# Patient Record
Sex: Male | Born: 1995 | Race: White | Hispanic: No | Marital: Single | Smoking: Never smoker
Health system: Southern US, Community
[De-identification: ages and names within clinical notes are randomized; demographics above are authoritative.]

## PROBLEM LIST (undated history)

## (undated) HISTORY — PX: TONSILLECTOMY: SUR1361

---
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
## 2018-10-19 ENCOUNTER — Emergency Department
Admission: EM | Admit: 2018-10-19 | Discharge: 2018-10-19 | Disposition: A | Discharge status: Home / Self Care | Payer: BC Managed Care – PPO | Source: Home / Self Care

## 2018-10-19 ENCOUNTER — Encounter: Payer: Self-pay | Admitting: Emergency Medicine

## 2018-10-19 ENCOUNTER — Other Ambulatory Visit: Payer: Self-pay

## 2018-10-19 DIAGNOSIS — S30861A Insect bite (nonvenomous) of abdominal wall, initial encounter: Secondary | ICD-10-CM

## 2018-10-19 DIAGNOSIS — W57XXXA Bitten or stung by nonvenomous insect and other nonvenomous arthropods, initial encounter: Secondary | ICD-10-CM

## 2018-10-19 DIAGNOSIS — L089 Local infection of the skin and subcutaneous tissue, unspecified: Secondary | ICD-10-CM

## 2018-10-19 MED ORDER — DOXYCYCLINE HYCLATE 100 MG PO CAPS: 100.0000 mg | ORAL_CAPSULE | Freq: Two times a day (BID) | ORAL | 0 refills | Status: DC

## 2018-10-19 NOTE — ED Provider Notes (Signed)
[facility]    CSN: [id number] Arrival date & time: 10/19/18  1150     History   Chief Complaint Chief Complaint  Patient presents with  . Tick Removal    HPI Joseph Solis is a 23 y.o. male.   HPI Joseph Solis is a 23 y.o. male presenting to UC with c/o tick bite to his Right flank. He removed the tick 2 days ago, it was not engorged. Pt believes it was there for less than 24 hours but has noticed 4 red small itchy painful bumps in the same area as the tick bite. Pt denies fever, chills, n/v/d. His mother was concerned about the redness around the bumps and encouraged pt be evaluated.     No past medical history on file.  There are no active problems to display for this patient.   Past Surgical History:  Procedure Laterality Date  . TONSILLECTOMY         Home Medications    Prior to Admission medications   Medication Sig Start Date End Date Taking? Authorizing Provider  doxycycline (VIBRAMYCIN) 100 MG capsule Take 1 capsule (100 mg total) by mouth 2 (two) times daily. One po bid x 7 days 10/19/18   Lurene ShadowPhelps, Michaela Broski O, PA-C    Family History Family History  Problem Relation Age of Onset  . Healthy Mother   . Healthy Father     Social History Social History   Tobacco Use  . Smoking status: Never Smoker  . Smokeless tobacco: Never Used  Substance Use Topics  . Alcohol use: Yes  . Drug use: Not on file     Allergies   Patient has no known allergies.   Review of Systems Review of Systems  Constitutional: Negative for chills and fever.  Gastrointestinal: Negative for diarrhea, nausea and vomiting.  Musculoskeletal: Negative for arthralgias and myalgias.  Skin: Positive for color change, rash and wound.     Physical Exam Triage Vital Signs ED Triage Vitals  Enc Vitals Group     BP      Pulse      Resp      Temp      Temp src      SpO2      Weight      Height      Head Circumference      Peak Flow      Pain Score      Pain Loc       Pain Edu?      Excl. in GC?    No data found.  Updated Vital Signs BP 107/70 (BP Location: Right Arm)   Pulse 68   Temp 98.5 F (36.9 C) (Oral)   Ht 5\' 11"  (1.803 m)   Wt 155 lb (70.3 kg)   SpO2 97%   BMI 21.62 kg/m   Visual Acuity Right Eye Distance:   Left Eye Distance:   Bilateral Distance:    Right Eye Near:   Left Eye Near:    Bilateral Near:     Physical Exam Vitals signs and nursing note reviewed.  Constitutional:      Appearance: Normal appearance. He is well-developed.  HENT:     Head: Normocephalic and atraumatic.  Neck:     Musculoskeletal: Normal range of motion.  Cardiovascular:     Rate and Rhythm: Normal rate.  Pulmonary:     Effort: Pulmonary effort is normal. No respiratory distress.  Musculoskeletal: Normal range of motion.  Skin:  General: Skin is warm and dry.     Findings: Erythema and rash present.          Comments: Right flank: four 3-54mm erythematous papules with scant yellow-clear drainage. Mildly tender.   Neurological:     Mental Status: He is alert and oriented to person, place, and time.  Psychiatric:        Behavior: Behavior normal.      UC Treatments / Results  Labs (all labs ordered are listed, but only abnormal results are displayed) Labs Reviewed - No data to display  EKG None  Radiology No results found.  Procedures Procedures (including critical care time)  Medications Ordered in UC Medications - No data to display  Initial Impression / Assessment and Plan / UC Course  I have reviewed the triage vital signs and the nursing notes.  Pertinent labs & imaging results that were available during my care of the patient were reviewed by me and considered in my medical decision making (see chart for details).     Hx and exam c/w infected insect bites. Although pt is low risk for tick borne disease given short duration of tick attachment, will cover skin infection with doxycycline.  AVS provided.  Final  Clinical Impressions(s) / UC Diagnoses   Final diagnoses:  Tick bite of right flank, initial encounter  Infected insect bite of abdomen, initial encounter     Discharge Instructions      Please take antibiotics as prescribed and be sure to complete entire course even if you start to feel better to ensure infection does not come back.  Keep area clean with warm water and mild soap. Pat dry.  If rash becomes more itchy, you may try over the counter hydrocortisone cream or calamine lotion.  Please follow up with family in 1 week if not improving.     ED Prescriptions    Medication Sig Dispense Auth. Provider   doxycycline (VIBRAMYCIN) 100 MG capsule Take 1 capsule (100 mg total) by mouth 2 (two) times daily. One po bid x 7 days 14 capsule Noe Gens, [place]     Controlled Substance Prescriptions [place] Controlled Substance Registry consulted? Not Applicable   Tyrell Antonio 10/19/18 1239

## 2018-10-19 NOTE — ED Triage Notes (Signed)
Tick bite 2 days ago, RT flank

## 2018-10-19 NOTE — Discharge Instructions (Signed)
°  Please take antibiotics as prescribed and be sure to complete entire course even if you start to feel better to ensure infection does not come back.  Keep area clean with warm water and mild soap. Pat dry.  If rash becomes more itchy, you may try over the counter hydrocortisone cream or calamine lotion.  Please follow up with family in 1 week if not improving.

## 2019-12-07 ENCOUNTER — Encounter: Payer: Self-pay | Admitting: Emergency Medicine

## 2019-12-07 ENCOUNTER — Other Ambulatory Visit: Payer: Self-pay

## 2019-12-07 ENCOUNTER — Ambulatory Visit
Admission: EM | Admit: 2019-12-07 | Discharge: 2019-12-07 | Disposition: A | Discharge status: Home / Self Care | Payer: BC Managed Care – PPO | Attending: Emergency Medicine | Admitting: Emergency Medicine

## 2019-12-07 DIAGNOSIS — J029 Acute pharyngitis, unspecified: Secondary | ICD-10-CM | POA: Insufficient documentation

## 2019-12-07 LAB — POCT RAPID STREP A (OFFICE): Rapid Strep A Screen: NEGATIVE

## 2019-12-07 MED ORDER — FLUTICASONE PROPIONATE 50 MCG/ACT NA SUSP: 1.0000 | Freq: Every day | NASAL | 0 refills | Status: AC

## 2019-12-07 MED ORDER — CETIRIZINE HCL 10 MG PO TABS: 10.0000 mg | ORAL_TABLET | Freq: Every day | ORAL | 0 refills | Status: AC

## 2019-12-07 NOTE — Discharge Instructions (Addendum)

## 2019-12-07 NOTE — ED Provider Notes (Signed)
[name]    CSN: [id number] Arrival date & time: 12/07/19  1628      History   Chief Complaint Chief Complaint  Patient presents with  . URI    HPI Joseph Solis is a 24 y.o. male  Subjective:   History was provided by the patient. Joseph Solis is a 24 y.o. male who presents for evaluation of a sore throat. Associated symptoms include sinus and nasal congestion, sore throat and fatigue. Onset of symptoms was 4 days ago, gradually worsening since that time.  He is drinking plenty of fluids. He has not had recent close exposure to someone with proven streptococcal pharyngitis.  Underwent rapid Covid testing earlier today.  No known sick contacts. The following portions of the patient's history were reviewed and updated as appropriate: allergies, current medications, past family history, past medical history, past social history, past surgical history and problem list.      History reviewed. No pertinent past medical history.  There are no problems to display for this patient.   Past Surgical History:  Procedure Laterality Date  . TONSILLECTOMY         Home Medications    Prior to Admission medications   Medication Sig Start Date End Date Taking? Authorizing Provider  cetirizine (ZYRTEC ALLERGY) 10 MG tablet Take 1 tablet (10 mg total) by mouth daily. 12/07/19   Hall-Potvin, [place], PA-C  fluticasone (FLONASE) 50 MCG/ACT nasal spray Place 1 spray into both nostrils daily. 12/07/19   Hall-Potvin, [place], PA-C    Family History Family History  Problem Relation Age of Onset  . Healthy Mother   . Healthy Father     Social History Social History   Tobacco Use  . Smoking status: Never Smoker  . Smokeless tobacco: Never Used  Vaping Use  . Vaping Use: Some days  Substance Use Topics  . Alcohol use: Yes  . Drug use: Not on file     Allergies   Patient has no known allergies.   Review of Systems As per HPI  Physical Exam Triage Vital  Signs ED Triage Vitals  Enc Vitals Group     BP      Pulse      Resp      Temp      Temp src      SpO2      Weight      Height      Head Circumference      Peak Flow      Pain Score      Pain Loc      Pain Edu?      Excl. in GC?    No data found.  Updated Vital Signs BP 118/73 (BP Location: Right Arm)   Pulse 83   Temp 98.1 F (36.7 C) (Oral)   Resp 18   SpO2 96%   Visual Acuity Right Eye Distance:   Left Eye Distance:   Bilateral Distance:    Right Eye Near:   Left Eye Near:    Bilateral Near:     Physical Exam Constitutional:      General: He is not in acute distress. HENT:     Head: Normocephalic and atraumatic.     Right Ear: Tympanic membrane, ear canal and external ear normal.     Left Ear: Tympanic membrane, ear canal and external ear normal.     Nose: No nasal deformity, congestion or rhinorrhea.     Comments: Turbinates nonedematous bilaterally with  pink mucosa    Mouth/Throat:     Mouth: Mucous membranes are moist.     Tongue: Tongue does not deviate from midline.     Pharynx: Oropharynx is clear. Uvula midline.     Comments: Erythema present without uvular swelling.  Uvula is midline, rises symmetrically.  Tonsils surgically absent. Eyes:     General: No scleral icterus.    Conjunctiva/sclera: Conjunctivae normal.     Pupils: Pupils are equal, round, and reactive to light.  Cardiovascular:     Rate and Rhythm: Normal rate and regular rhythm.  Pulmonary:     Effort: Pulmonary effort is normal. No respiratory distress.     Breath sounds: No wheezing.  Musculoskeletal:     Cervical back: Normal range of motion and neck supple. Tenderness present. No muscular tenderness.  Lymphadenopathy:     Cervical: Cervical adenopathy present.  Neurological:     Mental Status: He is alert.      UC Treatments / Results  Labs (all labs ordered are listed, but only abnormal results are displayed) Labs Reviewed  CULTURE, GROUP A STREP [facility])  POCT  RAPID STREP A (OFFICE)    EKG   Radiology No results found.  Procedures Procedures (including critical care time)  Medications Ordered in UC Medications - No data to display  Initial Impression / Assessment and Plan / UC Course  I have reviewed the triage vital signs and the nursing notes.  Pertinent labs & imaging results that were available during my care of the patient were reviewed by me and considered in my medical decision making (see chart for details).     Patient afebrile, nontoxic in office today.  No airway compromise.  Rapid strep negative, culture pending.  Patient declined confirmatory Covid PCR testing at this time.  We will treat supportively as outlined below.  Return precautions discussed, pt verbalized understanding and is agreeable to plan. Final Clinical Impressions(s) / UC Diagnoses   Final diagnoses:  Sore throat     Discharge Instructions     Your rapid strep test was negative today.  The culture is pending.  Please look on your MyChart for test results.   We will notify you if the culture positive and outline a treatment plan at that time.   Please continue Tylenol and/or Ibuprofen as needed for fever, pain.  May try warm salt water gargles, cepacol lozenges, throat spray, warm tea or water with lemon/honey, or OTC cold relief medicine for throat discomfort.   For congestion: take a daily anti-histamine like Zyrtec, Claritin, and a oral decongestant to help with post nasal drip that may be irritating your throat.   It is important to stay hydrated: drink plenty of fluids (primarily water) to keep your throat moisturized and help further relieve irritation/discomfort.     ED Prescriptions    Medication Sig Dispense Auth. Provider   cetirizine (ZYRTEC ALLERGY) 10 MG tablet Take 1 tablet (10 mg total) by mouth daily. 30 tablet Hall-Potvin, [place], PA-C   fluticasone (FLONASE) 50 MCG/ACT nasal spray Place 1 spray into both nostrils daily. 16 g  Hall-Potvin, [place], PA-C     PDMP not reviewed this encounter.   Joseph Solis, [place] 12/07/19 1926

## 2019-12-07 NOTE — ED Triage Notes (Signed)
Patient presents to [facility] for assessment of headaches, nasal congestion, sore throat x 4 days.  Negative rapid COVID today.  Hx of tonsillectomy.

## 2019-12-11 LAB — CULTURE, GROUP A STREP (THRC)

## 2021-09-25 ENCOUNTER — Ambulatory Visit
Admission: EM | Admit: 2021-09-25 | Discharge: 2021-09-25 | Disposition: A | Discharge status: Home / Self Care | Payer: BC Managed Care – PPO | Attending: Student | Admitting: Student

## 2021-09-25 DIAGNOSIS — Z113 Encounter for screening for infections with a predominantly sexual mode of transmission: Secondary | ICD-10-CM | POA: Insufficient documentation

## 2021-09-25 DIAGNOSIS — L731 Pseudofolliculitis barbae: Secondary | ICD-10-CM | POA: Insufficient documentation

## 2021-09-25 NOTE — ED Provider Notes (Signed)
[facility]    CSN: [id number] Arrival date & time: 09/25/21  1632      History   Chief Complaint Chief Complaint  Patient presents with   Rash    HPI Xyon Lukasik is a 26 y.o. male presenting with ingrown hairs in the groin area for 6 days.  History noncontributory.  States he typically shaves the pubic hairs, however 6 days ago he noted several bumps in the area.  1 is a small pustule, and the others have formed small scabs.  He states there were never any vesicular or burning lesions.  He denies any penile or testicular pain or swelling, denies dysuria, denies hematuria, denies abdominal pain, denies flank pain, denies fever/chills.  Same male partner, low concern for STIs.  HPI  History reviewed. No pertinent past medical history.  There are no problems to display for this patient.   Past Surgical History:  Procedure Laterality Date   TONSILLECTOMY         Home Medications    Prior to Admission medications   Medication Sig Start Date End Date Taking? Authorizing Provider  cetirizine (ZYRTEC ALLERGY) 10 MG tablet Take 1 tablet (10 mg total) by mouth daily. 12/07/19   Hall-Potvin, [place], PA-C  fluticasone (FLONASE) 50 MCG/ACT nasal spray Place 1 spray into both nostrils daily. 12/07/19   Hall-Potvin, [place], PA-C    Family History Family History  Problem Relation Age of Onset   Healthy Mother    Healthy Father     Social History Social History   Tobacco Use   Smoking status: Never   Smokeless tobacco: Never  Vaping Use   Vaping Use: Some days  Substance Use Topics   Alcohol use: Yes     Allergies   Patient has no known allergies.   Review of Systems Review of Systems  Skin:  Positive for rash.  All other systems reviewed and are negative.   Physical Exam Triage Vital Signs ED Triage Vitals  Enc Vitals Group     BP 09/25/21 1641 (!) 147/77     Pulse Rate 09/25/21 1641 77     Resp 09/25/21 1641 18     Temp 09/25/21 1641 97.9  F (36.6 C)     Temp src --      SpO2 09/25/21 1641 99 %     Weight --      Height --      Head Circumference --      Peak Flow --      Pain Score 09/25/21 1651 0     Pain Loc --      Pain Edu? --      Excl. in GC? --    No data found.  Updated Vital Signs BP (!) 147/77   Pulse 77   Temp 97.9 F (36.6 C)   Resp 18   SpO2 99%   Visual Acuity Right Eye Distance:   Left Eye Distance:   Bilateral Distance:    Right Eye Near:   Left Eye Near:    Bilateral Near:     Physical Exam Vitals reviewed. Exam conducted with a chaperone present.  Constitutional:      General: He is not in acute distress.    Appearance: Normal appearance. He is not ill-appearing.  HENT:     Head: Normocephalic and atraumatic.  Pulmonary:     Effort: Pulmonary effort is normal.  Genitourinary:      Comments: Chaperone: RN, Denorah 4-5 scattered ingrown hairs,  one at the base of the penis. One is a small 39mm pustule, without surrounding erythema or warmth. There are no penile or testicular changes or swelling.  Neurological:     General: No focal deficit present.     Mental Status: He is alert and oriented to person, place, and time.  Psychiatric:        Mood and Affect: Mood normal.        Behavior: Behavior normal.        Thought Content: Thought content normal.        Judgment: Judgment normal.     UC Treatments / Results  Labs (all labs ordered are listed, but only abnormal results are displayed) Labs Reviewed - No data to display  EKG   Radiology No results found.  Procedures Procedures (including critical care time)  Medications Ordered in UC Medications - No data to display  Initial Impression / Assessment and Plan / UC Course  I have reviewed the triage vital signs and the nursing notes.  Pertinent labs & imaging results that were available during my care of the patient were reviewed by me and considered in my medical decision making (see chart for details).      This patient is a very pleasant 26 y.o. year old male presenting with few ingrown hairs in the pubic area. Afebrile, nontachy. No other penile or testicular concerns. I collected a G/C/trich swab. Reassurance provided. Only one ingrown hair has a small localized 26mm pustule, so advised warm compresses rather than PO abx for now, he is in agreement. There are no vesicular lesions. ED return precautions discussed. Patient verbalizes understanding and agreement.  .   Final Clinical Impressions(s) / UC Diagnoses   Final diagnoses:  Ingrown hair     Discharge Instructions      -Warm compresses, shave with grain of hair    ED Prescriptions   None    PDMP not reviewed this encounter.   Rhys Martini, PA-C 09/25/21 1719

## 2021-09-25 NOTE — Discharge Instructions (Addendum)
-  Warm compresses, shave with grain of hair

## 2021-09-25 NOTE — ED Triage Notes (Signed)
Patient presents to Urgent Care with complaints of pimple like bumps on penis x since Friday. He states shaving the day before. Pt states unprotected sex middle of last week. Wants to rule out STDs. Pt applying hydrocortisone cream.

## 2021-09-26 LAB — CYTOLOGY, (ORAL, ANAL, URETHRAL) ANCILLARY ONLY
Chlamydia: NEGATIVE
Comment: NEGATIVE
Comment: NEGATIVE
Comment: NORMAL
Neisseria Gonorrhea: NEGATIVE
Trichomonas: NEGATIVE
